# Patient Record
Sex: Female | Born: 1945 | Race: White | Hispanic: No | Marital: Married | Smoking: Never smoker
Health system: Southern US, Community
[De-identification: ages and names within clinical notes are randomized; demographics above are authoritative.]

## PROBLEM LIST (undated history)

## (undated) DIAGNOSIS — M810 Age-related osteoporosis without current pathological fracture: Secondary | ICD-10-CM

## (undated) HISTORY — PX: BUNIONECTOMY: SHX129

## (undated) HISTORY — PX: BACK SURGERY: SHX140

## (undated) HISTORY — PX: DENTAL SURGERY: SHX609

---

## 2013-05-18 ENCOUNTER — Other Ambulatory Visit (HOSPITAL_COMMUNITY): Payer: Self-pay | Admitting: *Deleted

## 2013-05-19 ENCOUNTER — Encounter (HOSPITAL_COMMUNITY)
Admission: RE | Admit: 2013-05-19 | Discharge: 2013-05-19 | Disposition: A | Discharge status: Home / Self Care | Payer: Medicare Other | Source: Ambulatory Visit | Attending: Obstetrics and Gynecology | Admitting: Obstetrics and Gynecology

## 2013-05-19 DIAGNOSIS — M810 Age-related osteoporosis without current pathological fracture: Secondary | ICD-10-CM | POA: Insufficient documentation

## 2013-05-19 MED ORDER — ZOLEDRONIC ACID 5 MG/100ML IV SOLN: 5.0000 mg | Freq: Once | INTRAVENOUS | Status: DC

## 2013-05-19 MED ORDER — ZOLEDRONIC ACID 5 MG/100ML IV SOLN
INTRAVENOUS | Status: AC
  Administered 2013-05-19: 5 mg
  Filled 2013-05-19: qty 100

## 2017-12-18 ENCOUNTER — Emergency Department (HOSPITAL_COMMUNITY)
Admission: EM | Admit: 2017-12-18 | Discharge: 2017-12-18 | Disposition: A | Discharge status: Home / Self Care | Payer: Medicare Other | Attending: Emergency Medicine | Admitting: Emergency Medicine

## 2017-12-18 ENCOUNTER — Emergency Department (HOSPITAL_COMMUNITY): Payer: Medicare Other

## 2017-12-18 ENCOUNTER — Encounter (HOSPITAL_COMMUNITY): Payer: Self-pay | Admitting: Emergency Medicine

## 2017-12-18 ENCOUNTER — Other Ambulatory Visit: Payer: Self-pay

## 2017-12-18 DIAGNOSIS — Z79899 Other long term (current) drug therapy: Secondary | ICD-10-CM | POA: Diagnosis not present

## 2017-12-18 DIAGNOSIS — K5903 Drug induced constipation: Secondary | ICD-10-CM | POA: Insufficient documentation

## 2017-12-18 DIAGNOSIS — R112 Nausea with vomiting, unspecified: Secondary | ICD-10-CM | POA: Diagnosis present

## 2017-12-18 DIAGNOSIS — T402X5A Adverse effect of other opioids, initial encounter: Secondary | ICD-10-CM | POA: Diagnosis not present

## 2017-12-18 DIAGNOSIS — G8918 Other acute postprocedural pain: Secondary | ICD-10-CM | POA: Diagnosis not present

## 2017-12-18 HISTORY — DX: Age-related osteoporosis without current pathological fracture: M81.0

## 2017-12-18 LAB — COMPREHENSIVE METABOLIC PANEL
ALBUMIN: 3.9 g/dL (ref 3.5–5.0)
ALT: 18 U/L (ref 0–44)
AST: 14 U/L — AB (ref 15–41)
Alkaline Phosphatase: 110 U/L (ref 38–126)
Anion gap: 9 (ref 5–15)
BUN: 13 mg/dL (ref 8–23)
CHLORIDE: 101 mmol/L (ref 98–111)
CO2: 27 mmol/L (ref 22–32)
Calcium: 9.4 mg/dL (ref 8.9–10.3)
Creatinine, Ser: 0.75 mg/dL (ref 0.44–1.00)
GFR calc Af Amer: >60 mL/min (ref >60)
GFR calc non Af Amer: >60 mL/min (ref >60)
GLUCOSE: 107 mg/dL — AB (ref 70–99)
POTASSIUM: 3.8 mmol/L (ref 3.5–5.1)
Sodium: 137 mmol/L (ref 135–145)
Total Bilirubin: 0.7 mg/dL (ref 0.3–1.2)
Total Protein: 6.7 g/dL (ref 6.5–8.1)

## 2017-12-18 LAB — CBC WITH DIFFERENTIAL/PLATELET
BASOS ABS: 0 10*3/uL (ref 0.0–0.1)
BASOS PCT: 1 %
Eosinophils Absolute: 0.2 10*3/uL (ref 0.0–0.7)
Eosinophils Relative: 4 %
HEMATOCRIT: 43 % (ref 36.0–46.0)
Hemoglobin: 14 g/dL (ref 12.0–15.0)
Lymphocytes Relative: 20 %
Lymphs Abs: 1.3 10*3/uL (ref 0.7–4.0)
MCH: 29.4 pg (ref 26.0–34.0)
MCHC: 32.6 g/dL (ref 30.0–36.0)
MCV: 90.3 fL (ref 78.0–100.0)
MONO ABS: 0.6 10*3/uL (ref 0.1–1.0)
Monocytes Relative: 10 %
NEUTROS ABS: 4.3 10*3/uL (ref 1.7–7.7)
Neutrophils Relative %: 65 %
PLATELETS: 333 10*3/uL (ref 150–400)
RBC: 4.76 MIL/uL (ref 3.87–5.11)
RDW: 12.8 % (ref 11.5–15.5)
WBC: 6.6 10*3/uL (ref 4.0–10.5)

## 2017-12-18 LAB — URINALYSIS, ROUTINE W REFLEX MICROSCOPIC
Bilirubin Urine: NEGATIVE
GLUCOSE, UA: NEGATIVE mg/dL
Hgb urine dipstick: NEGATIVE
Ketones, ur: NEGATIVE mg/dL
LEUKOCYTES UA: NEGATIVE
Nitrite: NEGATIVE
PH: 7 (ref 5.0–8.0)
Protein, ur: NEGATIVE mg/dL
Specific Gravity, Urine: 1.005 (ref 1.005–1.030)

## 2017-12-18 LAB — LIPASE, BLOOD: Lipase: 20 U/L (ref 11–51)

## 2017-12-18 LAB — POC OCCULT BLOOD, ED: Fecal Occult Bld: NEGATIVE

## 2017-12-18 MED ORDER — MAGNESIUM CITRATE PO SOLN: ORAL | 0 refills | Status: AC

## 2017-12-18 MED ORDER — PROMETHAZINE HCL 25 MG PO TABS: 25.0000 mg | ORAL_TABLET | Freq: Four times a day (QID) | ORAL | 0 refills | Status: AC | PRN

## 2017-12-18 MED ORDER — PROMETHAZINE HCL 25 MG/ML IJ SOLN
12.5000 mg | Freq: Once | INTRAMUSCULAR | Status: AC
  Administered 2017-12-18: 12.5 mg via INTRAVENOUS
  Filled 2017-12-18: qty 1

## 2017-12-18 MED ORDER — ONDANSETRON HCL 4 MG/2ML IJ SOLN
4.0000 mg | Freq: Once | INTRAMUSCULAR | Status: AC
  Administered 2017-12-18: 4 mg via INTRAVENOUS
  Filled 2017-12-18: qty 2

## 2017-12-18 MED ORDER — SODIUM CHLORIDE 0.9 % IV BOLUS
1000.0000 mL | Freq: Once | INTRAVENOUS | Status: AC
  Administered 2017-12-18: 1000 mL via INTRAVENOUS

## 2017-12-18 MED ORDER — MORPHINE SULFATE (PF) 4 MG/ML IV SOLN
4.0000 mg | Freq: Once | INTRAVENOUS | Status: AC
  Administered 2017-12-18: 4 mg via INTRAVENOUS
  Filled 2017-12-18: qty 1

## 2017-12-18 NOTE — ED Triage Notes (Signed)
Back surgery last Friday, Continues to have pain, taking oxycodone.  ambulatory at home. Complaints of Nausea, vomiting and constipation. Decrease appetite.

## 2017-12-18 NOTE — Discharge Instructions (Addendum)
Use the phenergan in place of your zofran since this medicine does not seem to help with your nausea and vomiting.  Make sure you take the hydrocodone tablet with food to help minimize nausea.  I also encourage you to continue taking your miralax daily to keep your stools soft to help avoid impaction.  I recommend follow up with Dr. Laurian Brim'toole regarding pain management if your back pain persists.  The magnesium citrate prescribed is a fairly powerful stimulant laxative and should help you have a bowel movement, fairly quickly.  Follow the prescribed instructions.

## 2017-12-18 NOTE — ED Provider Notes (Signed)
[facility] EMERGENCY DEPARTMENT Provider Note   CSN: [id number] Arrival date & time: 12/18/17  2440     History   Chief Complaint Chief Complaint  Patient presents with  . Post-op Problem    Back surgery    HPI Tamara Owens is a 72 y.o. female with a history of osteoporosis and vertebral compression fractures x3 with her third kyphoplasty performed last week by Dr. Laurian Brim in [place] presenting with nausea, vomiting persistent back pain and constipation since her procedure.  She reports multiple episodes of vomiting, has been able to tolerate small sips of fluid but has increasing or fatigue and weakness.  She also endorses abdominal distention.  She has been using MiraLAX and had a large stool 4 days ago, but none since.  She also endorses generalized back pain, denies fevers or chills, cough, shortness of breath, chest pain dysuria.  She is taking hydrocodone, but was not aware until yesterday that she is supposed to take this medication with food, possibly causing the nausea and vomiting.  However this medication is also not controlling her pain symptoms.  Dr. Laurian Brim was aware she was having constipation and nausea and has started her on Zofran.  He is not aware of her worsening symptoms or her visit here today.  She is found no alleviators for her symptoms.  The history is provided by the patient and the spouse.    Past Medical History:  Diagnosis Date  . Osteoporosis     There are no active problems to display for this patient.   Past Surgical History:  Procedure Laterality Date  . BACK SURGERY    . BUNIONECTOMY    . DENTAL SURGERY       OB History   None      Home Medications    Prior to Admission medications   Medication Sig Start Date End Date Taking? Authorizing Provider  baclofen (LIORESAL) 10 MG tablet Take 10 mg by mouth 2 (two) times daily.  12/03/17 01/02/18 Yes [provider]  gabapentin (NEURONTIN) 100 MG capsule Take 100 mg by mouth 3  (three) times daily.  11/17/17  Yes [provider]  HYDROcodone-acetaminophen (NORCO) 7.5-325 MG tablet Take 1 tablet by mouth every 6 (six) hours as needed for moderate pain.  12/12/17 01/11/18 Yes [provider]  magnesium citrate SOLN Drink 1/2 bottle, then the remaining 1/2 after 30 minutes if this has not resulted in a bowel movement. 12/18/17   Burgess Amor, PA-C  promethazine (PHENERGAN) 25 MG tablet Take 1 tablet (25 mg total) by mouth every 6 (six) hours as needed for nausea or vomiting. 12/18/17   Burgess Amor, PA-C    Family History No family history on file.  Social History Social History   Tobacco Use  . Smoking status: Never Smoker  Substance Use Topics  . Alcohol use: Not Currently  . Drug use: Not Currently     Allergies   Patient has no known allergies.   Review of Systems Review of Systems  Constitutional: Negative for chills and fever.  HENT: Negative for congestion and sore throat.   Eyes: Negative.   Respiratory: Negative for chest tightness and shortness of breath.   Cardiovascular: Negative for chest pain.  Gastrointestinal: Positive for abdominal distention, constipation, nausea and vomiting. Negative for abdominal pain.  Genitourinary: Negative.  Negative for difficulty urinating and dysuria.  Musculoskeletal: Positive for back pain. Negative for arthralgias, joint swelling and neck pain.  Skin: Negative.  Negative for rash and  wound.  Neurological: Negative for dizziness, weakness, light-headedness, numbness and headaches.  Psychiatric/Behavioral: Negative.      Physical Exam Updated Vital Signs BP (!) 187/89 (BP Location: Right Arm)   Pulse (!) 106   Temp 98.3 F (36.8 C) (Oral)   Resp 18   Ht 5\' 6"  (1.676 m)   Wt 68 kg   SpO2 99%   BMI 24.21 kg/m   Physical Exam  Constitutional: She appears well-developed and well-nourished.  HENT:  Head: Normocephalic and atraumatic.  Mouth/Throat: Mucous membranes are dry.  Eyes:  Conjunctivae are normal.  Neck: Normal range of motion.  Cardiovascular: Normal rate, regular rhythm, normal heart sounds and intact distal pulses.  Pulmonary/Chest: Effort normal and breath sounds normal. She has no wheezes.  Abdominal: Soft. Bowel sounds are normal. She exhibits distension. There is no hepatosplenomegaly. There is no tenderness. There is no rigidity, no rebound and no guarding.  Mild abdominal distention with increased tympany.  No pain with palpation.  Genitourinary: Rectal exam shows no mass and guaiac negative stool.  Genitourinary Comments: No stool in rectal vault.   Musculoskeletal: Normal range of motion.       Thoracic back: She exhibits no swelling, no edema and no deformity.  Small incision site near the T12 level, no erythema, edema or drainage.  nontender.  Neurological: She is alert.  Skin: Skin is warm and dry.  Psychiatric: She has a normal mood and affect.  Nursing note and vitals reviewed.    ED Treatments / Results  Labs (all labs ordered are listed, but only abnormal results are displayed) Labs Reviewed  COMPREHENSIVE METABOLIC PANEL - Abnormal; Notable for the following components:      Result Value   Glucose, Bld 107 (*)    AST 14 (*)    All other components within normal limits  CBC WITH DIFFERENTIAL/PLATELET  LIPASE, BLOOD  URINALYSIS, ROUTINE W REFLEX MICROSCOPIC  POC OCCULT BLOOD, ED    EKG None  Radiology Dg Abd 2 Views  Result Date: 12/18/2017 CLINICAL DATA:  Nausea and vomiting. EXAM: ABDOMEN - 2 VIEW COMPARISON:  No recent prior. FINDINGS: Soft tissue structures of the abdomen are unremarkable. Stool noted throughout the colon. No bowel distention or free air. Thoracolumbar spine degenerative change. Prior thoracic vertebroplasties. IMPRESSION: No acute abnormality identified. Stool noted throughout colon. No bowel distention or free air. Electronically Signed   By: Maisie Fushomas  Register   On: 12/18/2017 09:47     Procedures Procedures (including critical care time)  Medications Ordered in ED Medications  sodium chloride 0.9 % bolus 1,000 mL (0 mLs Intravenous Stopped 12/18/17 0955)  ondansetron (ZOFRAN) injection 4 mg (4 mg Intravenous Given 12/18/17 0842)  morphine 4 MG/ML injection 4 mg (4 mg Intravenous Given 12/18/17 0842)  promethazine (PHENERGAN) injection 12.5 mg (12.5 mg Intravenous Given 12/18/17 0907)  sodium chloride 0.9 % bolus 1,000 mL (0 mLs Intravenous Stopped 12/18/17 1119)     Initial Impression / Assessment and Plan / ED Course  I have reviewed the triage vital signs and the nursing notes.  Pertinent labs & imaging results that were available during my care of the patient were reviewed by me and considered in my medical decision making (see chart for details).     Pain improved, pt still with nausea after zofran given.  Phenergan 12.5 mg given with resolution of nausea.  She was able to tolerate PO intake after this med.  Labs and imaging reviewed. Suspect patients primary sx secondary to opiate  induced constipation.  Discussed options and f/u care including mag citrate now and continued miralax. Phenergan prescribed for n/v, plan f/u with pcp or Dr. Weldon Inchestoole if pain is not improving.  No sign of post surgical infection.  No impaction or xray finding to suggest obstruction.    Pt was seen by Dr Hyacinth MeekerMiller during todays visit.  Final Clinical Impressions(s) / ED Diagnoses   Final diagnoses:  Constipation due to opioid therapy  Post-operative pain    ED Discharge Orders         Ordered    magnesium citrate SOLN     12/18/17 1054    promethazine (PHENERGAN) 25 MG tablet  Every 6 hours PRN     12/18/17 1054           Victoriano Laindol, Ahsley Attwood, PA-C 12/18/17 1723    Eber HongMiller, Brian, MD 12/18/17 2011

## 2017-12-18 NOTE — ED Notes (Signed)
Pt given water to drink. 

## 2017-12-18 NOTE — ED Provider Notes (Signed)
The patient is a 72 year old female who underwent a kyphoplasty recently, she presents today with ongoing back pain though it is seemingly adequately controlled with pain medications.  That being said these opiate medications have been causing her some constipation and she has developed constipation as well as nausea after starting these medicines.  On exam she has a soft abdomen which is minimally tender, there is no guarding, she otherwise appears well.  She has received IV fluids, she has been instructed on the care of this condition including the use of laxatives and the avoidance of opiates if at all possible.  As the Zofran was not working she will be given Phenergan for home.  She and the family expressed their understanding.  Medical screening examination/treatment/procedure(s) were conducted as a shared visit with non-physician practitioner(s) and myself.  I personally evaluated the patient during the encounter.  Clinical Impression:   Final diagnoses:  Constipation due to opioid therapy  Post-operative pain         Eber HongMiller, Betzabeth Derringer, MD 12/18/17 1127

## 2017-12-18 NOTE — ED Notes (Signed)
Pt cannot give urine sample at time. States she went before she came to ED.

## 2020-04-13 IMAGING — DX DG ABDOMEN 2V
2 series · 2 of 2 positions shown · non-contrast
Comparison: No recent prior.

CLINICAL DATA: Nausea and vomiting.

EXAM:
ABDOMEN - 2 VIEW

[abdomen erect]
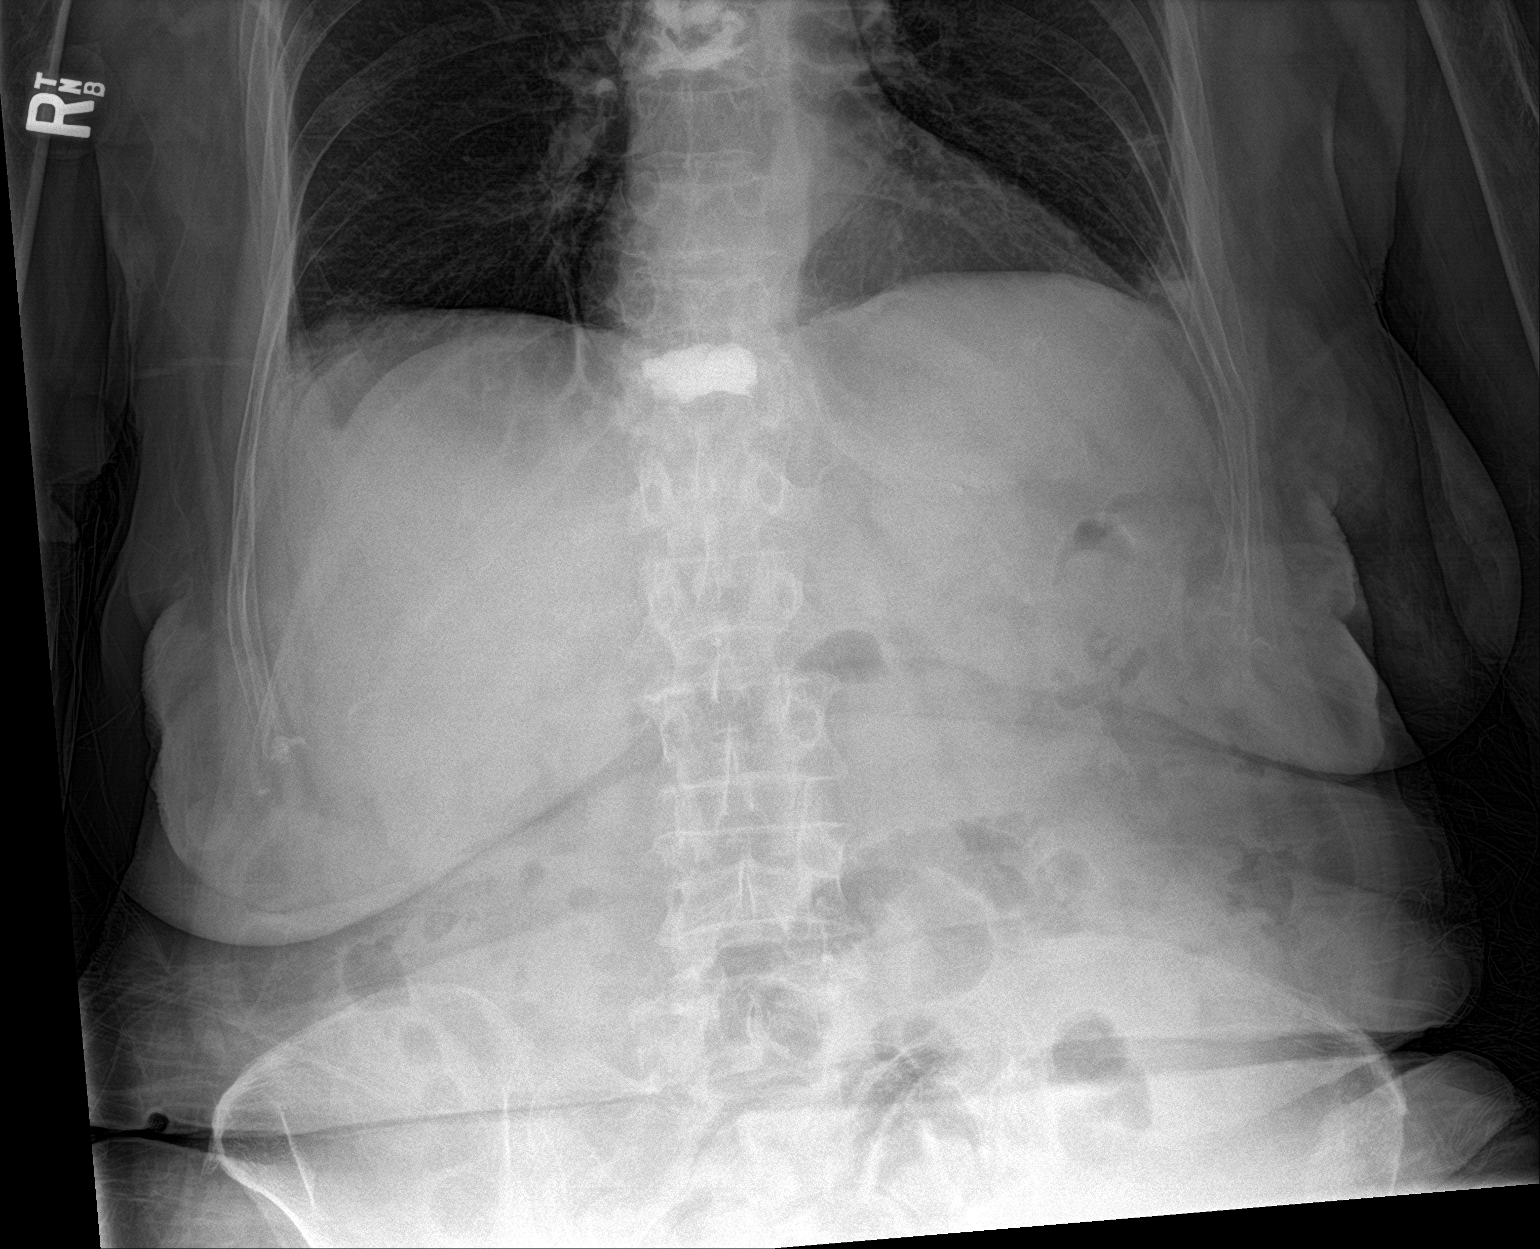

[abdomen supine]
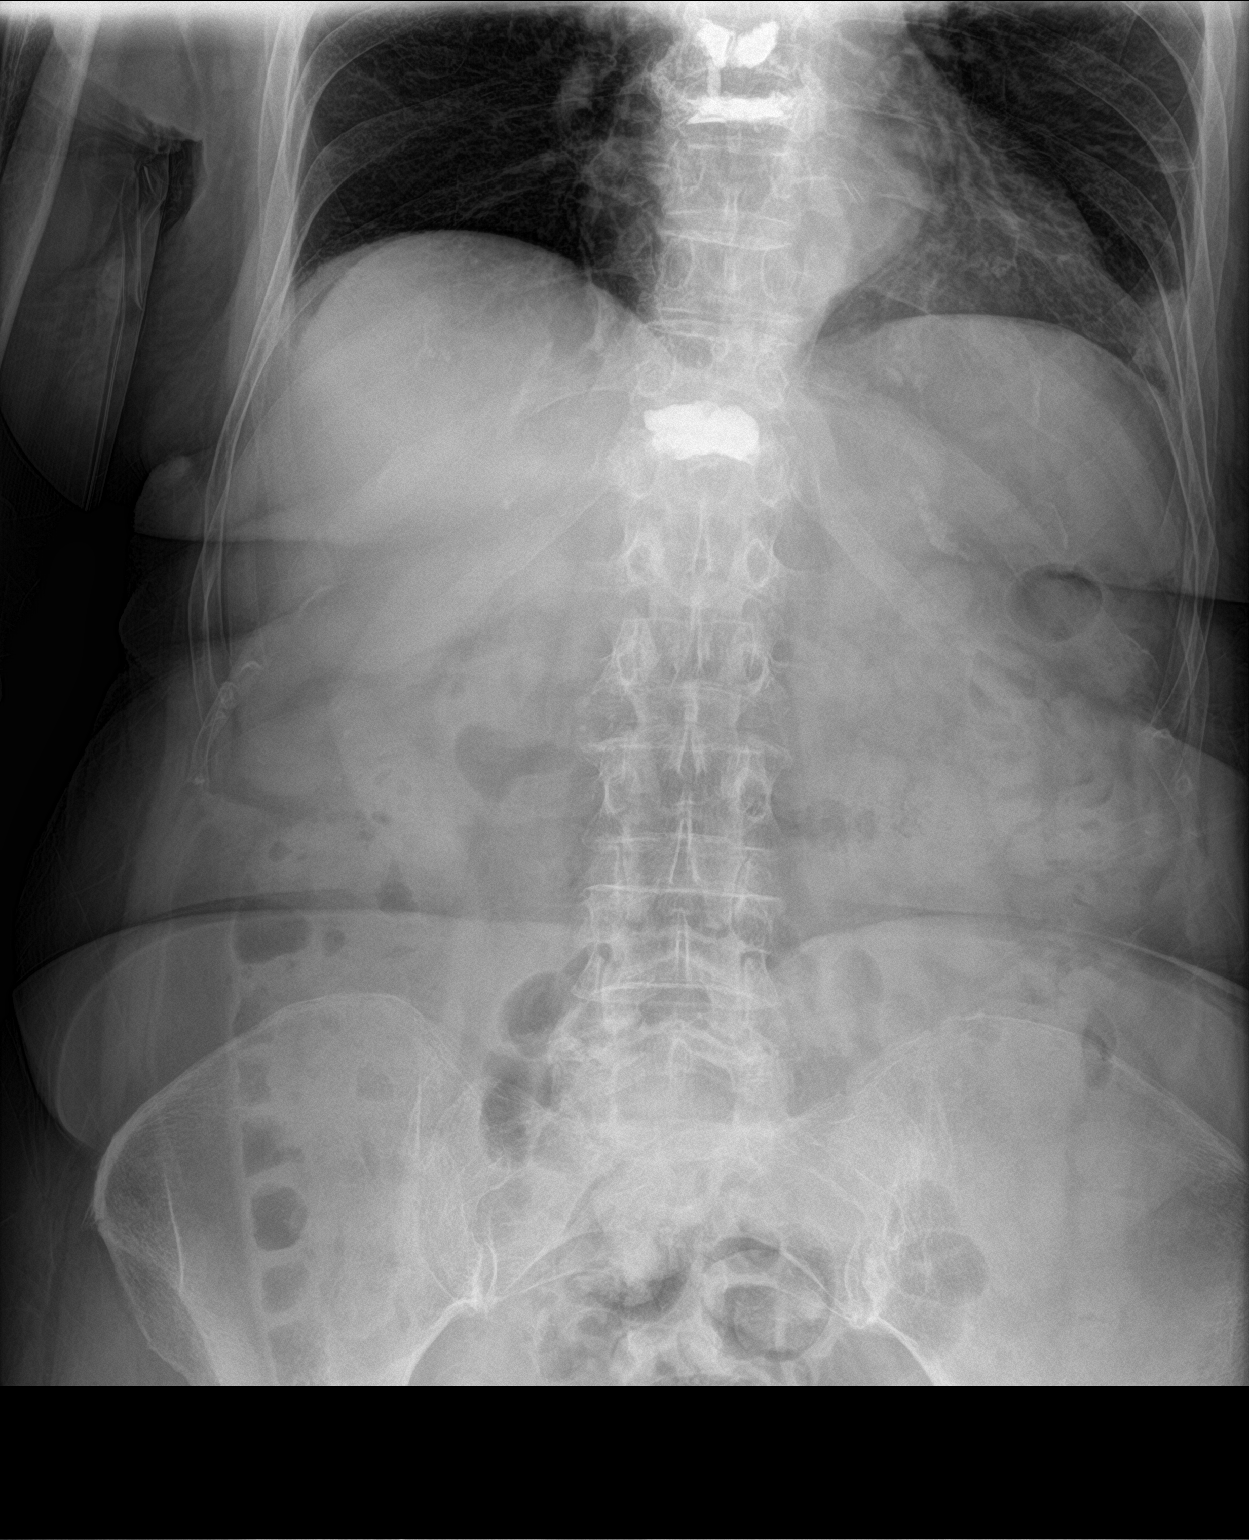

[2 of 2 positions shown; findings below may reference images not displayed]

FINDINGS: Soft tissue structures of the abdomen are unremarkable. Stool noted
throughout the colon. No bowel distention or free air. Thoracolumbar
spine degenerative change. Prior thoracic vertebroplasties.
IMPRESSION: No acute abnormality identified. Stool noted throughout colon. No
bowel distention or free air.
